# Patient Record
Sex: Male | Born: 1990 | Race: White | Hispanic: No | Marital: Single | Smoking: Current every day smoker
Health system: Southern US, Community
[De-identification: ages and names within clinical notes are randomized; demographics above are authoritative.]

## PROBLEM LIST (undated history)

## (undated) DIAGNOSIS — F410 Panic disorder [episodic paroxysmal anxiety] without agoraphobia: Secondary | ICD-10-CM

## (undated) DIAGNOSIS — R42 Dizziness and giddiness: Secondary | ICD-10-CM

## (undated) HISTORY — DX: Panic disorder (episodic paroxysmal anxiety): F41.0

## (undated) HISTORY — DX: Dizziness and giddiness: R42

---
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
## 2013-08-06 ENCOUNTER — Emergency Department (HOSPITAL_COMMUNITY)
Admission: EM | Admit: 2013-08-06 | Discharge: 2013-08-07 | Discharge status: Against Medical Advice | Payer: Self-pay | Attending: Emergency Medicine | Admitting: Emergency Medicine

## 2013-08-06 ENCOUNTER — Encounter (HOSPITAL_COMMUNITY): Payer: Self-pay | Admitting: Emergency Medicine

## 2013-08-06 DIAGNOSIS — H5710 Ocular pain, unspecified eye: Secondary | ICD-10-CM | POA: Insufficient documentation

## 2013-08-06 NOTE — ED Notes (Signed)
FB of lt eye, says he was weed eating and something hit is lt eye

## 2013-08-07 NOTE — ED Notes (Signed)
No answer when pt called to treatment room. 

## 2013-08-07 NOTE — ED Notes (Signed)
Called patient to place in room. No answer. 

## 2023-01-12 NOTE — Progress Notes (Unsigned)
Established Patient Office Visit  Subjective   Patient ID: Jesus Huffman, male    DOB: 1990-06-20  Age: 32 y.o. MRN: [id number]  No chief complaint on file.   HPI  There are no problems to display for this patient.  No past medical history on file. No past surgical history on file. Social History   Tobacco Use   Smoking status: Never  Substance Use Topics   Alcohol use: No   Drug use: No   Social History   Socioeconomic History   Marital status: Single    Spouse name: Not on file   Number of children: Not on file   Years of education: Not on file   Highest education level: Not on file  Occupational History   Not on file  Tobacco Use   Smoking status: Never   Smokeless tobacco: Not on file  Substance and Sexual Activity   Alcohol use: No   Drug use: No   Sexual activity: Not on file  Other Topics Concern   Not on file  Social History Narrative   Not on file   Social Determinants of Health   Financial Resource Strain: Not on file  Food Insecurity: Not on file  Transportation Needs: Not on file  Physical Activity: Not on file  Stress: Not on file  Social Connections: Not on file  Intimate Partner Violence: Not on file   No family status information on file.   No family history on file. No Known Allergies    ROS Negative unless indicated in HPI   Objective:     There were no vitals taken for this visit. BP Readings from Last 3 Encounters:  No data found for BP   Wt Readings from Last 3 Encounters:  No data found for Wt      Physical Exam   No results found for any visits on 01/13/23.     Assessment & Plan:  There are no diagnoses linked to this encounter.  Continue healthy lifestyle choices, including diet (rich in fruits, vegetables, and lean proteins, and low in salt and simple carbohydrates) and exercise (at least 30 minutes of moderate physical activity daily).     The above assessment and management plan was discussed with the  patient. The patient verbalized understanding of and has agreed to the management plan. Patient is aware to call the clinic if they develop any new symptoms or if symptoms persist or worsen. Patient is aware when to return to the clinic for a follow-up visit. Patient educated on when it is appropriate to go to the emergency department.  No follow-ups on file.    Arrie Aran Santa Lighter, DNP [facility] [address] [place], [place] [address] [phone]

## 2023-01-13 ENCOUNTER — Ambulatory Visit: Payer: BC Managed Care – PPO | Admitting: Nurse Practitioner

## 2023-01-13 ENCOUNTER — Encounter: Payer: Self-pay | Admitting: Nurse Practitioner

## 2023-01-13 VITALS — BP 115/63 | HR 83 | Temp 98.2°F | Ht 70.5 in | Wt 140.0 lb

## 2023-01-13 DIAGNOSIS — Z0001 Encounter for general adult medical examination with abnormal findings: Secondary | ICD-10-CM | POA: Diagnosis not present

## 2023-01-13 DIAGNOSIS — F410 Panic disorder [episodic paroxysmal anxiety] without agoraphobia: Secondary | ICD-10-CM | POA: Diagnosis not present

## 2023-01-13 DIAGNOSIS — R454 Irritability and anger: Secondary | ICD-10-CM | POA: Insufficient documentation

## 2023-01-13 DIAGNOSIS — Z8249 Family history of ischemic heart disease and other diseases of the circulatory system: Secondary | ICD-10-CM | POA: Diagnosis not present

## 2023-01-13 DIAGNOSIS — Z833 Family history of diabetes mellitus: Secondary | ICD-10-CM | POA: Diagnosis not present

## 2023-01-13 DIAGNOSIS — F12920 Cannabis use, unspecified with intoxication, uncomplicated: Secondary | ICD-10-CM | POA: Insufficient documentation

## 2023-01-13 DIAGNOSIS — Z0000 Encounter for general adult medical examination without abnormal findings: Secondary | ICD-10-CM | POA: Insufficient documentation

## 2023-01-13 MED ORDER — ESCITALOPRAM OXALATE 10 MG PO TABS: 10.0000 mg | ORAL_TABLET | Freq: Every day | ORAL | 1 refills | Status: AC

## 2023-01-13 MED ORDER — OLANZAPINE 2.5 MG PO TABS: 2.5000 mg | ORAL_TABLET | Freq: Every day | ORAL | 1 refills | Status: AC

## 2023-01-14 LAB — CMP14+EGFR
ALT: 13 [IU]/L (ref 0–44)
AST: 16 [IU]/L (ref 0–40)
Albumin: 4.7 g/dL (ref 4.3–5.2)
Alkaline Phosphatase: 90 [IU]/L (ref 44–121)
BUN/Creatinine Ratio: 13 (ref 9–20)
BUN: 10 mg/dL (ref 6–20)
Bilirubin Total: 0.3 mg/dL (ref 0.0–1.2)
CO2: 26 mmol/L (ref 20–29)
Calcium: 10.2 mg/dL (ref 8.7–10.2)
Chloride: 104 mmol/L (ref 96–106)
Creatinine, Ser: 0.79 mg/dL (ref 0.76–1.27)
Globulin, Total: 2.3 g/dL (ref 1.5–4.5)
Glucose: 82 mg/dL (ref 70–99)
Potassium: 4.7 mmol/L (ref 3.5–5.2)
Sodium: 142 mmol/L (ref 134–144)
Total Protein: 7 g/dL (ref 6.0–8.5)
eGFR: 123 mL/min/{1.73_m2} (ref >59)

## 2023-01-14 LAB — CBC WITH DIFFERENTIAL/PLATELET
Basophils Absolute: 0.1 10*3/uL (ref 0.0–0.2)
Basos: 1 %
EOS (ABSOLUTE): 0.4 10*3/uL (ref 0.0–0.4)
Eos: 5 %
Hematocrit: 43.5 % (ref 37.5–51.0)
Hemoglobin: 14.8 g/dL (ref 13.0–17.7)
Immature Grans (Abs): 0 10*3/uL (ref 0.0–0.1)
Immature Granulocytes: 0 %
Lymphocytes Absolute: 2.7 10*3/uL (ref 0.7–3.1)
Lymphs: 36 %
MCH: 30.8 pg (ref 26.6–33.0)
MCHC: 34 g/dL (ref 31.5–35.7)
MCV: 90 fL (ref 79–97)
Monocytes Absolute: 0.5 10*3/uL (ref 0.1–0.9)
Monocytes: 7 %
Neutrophils Absolute: 3.9 10*3/uL (ref 1.4–7.0)
Neutrophils: 51 %
Platelets: 235 10*3/uL (ref 150–450)
RBC: 4.81 x10E6/uL (ref 4.14–5.80)
RDW: 12.7 % (ref 11.6–15.4)
WBC: 7.5 10*3/uL (ref 3.4–10.8)

## 2023-01-14 LAB — LIPID PANEL
Chol/HDL Ratio: 4.4 {ratio} (ref 0.0–5.0)
Cholesterol, Total: 133 mg/dL (ref 100–199)
HDL: 30 mg/dL — ABNORMAL LOW (ref >39)
LDL Chol Calc (NIH): 75 mg/dL (ref 0–99)
Triglycerides: 160 mg/dL — ABNORMAL HIGH (ref 0–149)
VLDL Cholesterol Cal: 28 mg/dL (ref 5–40)

## 2023-01-14 LAB — THYROID PANEL WITH TSH
Free Thyroxine Index: 1.5 (ref 1.2–4.9)
T3 Uptake Ratio: 21 % — ABNORMAL LOW (ref 24–39)
T4, Total: 7.2 ug/dL (ref 4.5–12.0)
TSH: 1.05 u[IU]/mL (ref 0.450–4.500)

## 2023-02-14 ENCOUNTER — Ambulatory Visit: Payer: BC Managed Care – PPO | Admitting: Nurse Practitioner

## 2023-02-16 ENCOUNTER — Encounter: Payer: Self-pay | Admitting: Nurse Practitioner

## 2024-05-18 ENCOUNTER — Encounter (HOSPITAL_COMMUNITY): Payer: Self-pay

## 2024-05-18 ENCOUNTER — Emergency Department (HOSPITAL_COMMUNITY)
Admission: EM | Admit: 2024-05-18 | Discharge: 2024-05-18 | Disposition: A | Discharge status: Home / Self Care | Payer: Self-pay | Attending: Emergency Medicine | Admitting: Emergency Medicine

## 2024-05-18 DIAGNOSIS — Z5321 Procedure and treatment not carried out due to patient leaving prior to being seen by health care provider: Secondary | ICD-10-CM | POA: Insufficient documentation

## 2024-05-18 DIAGNOSIS — K0889 Other specified disorders of teeth and supporting structures: Secondary | ICD-10-CM | POA: Insufficient documentation
